# Patient Record
Sex: Female | Born: 1945 | Race: White | Hispanic: No | Marital: Married | Smoking: Never smoker
Health system: Southern US, Community
[De-identification: ages and names within clinical notes are randomized; demographics above are authoritative.]

## PROBLEM LIST (undated history)

## (undated) HISTORY — PX: CATARACT EXTRACTION: SUR2

## (undated) HISTORY — PX: CHOLECYSTECTOMY: SHX55

---

## 2011-08-18 ENCOUNTER — Ambulatory Visit: Payer: Self-pay | Admitting: Ophthalmology

## 2012-02-21 ENCOUNTER — Ambulatory Visit: Payer: Self-pay | Admitting: Family Medicine

## 2014-04-16 IMAGING — US US EXTREM LOW VENOUS*R*
1 series · 14 of 22 positions shown · non-contrast
Comparison: none

REASON FOR EXAM: CR [id number] Rt Leg Pain Swelling Eval DVT
COMMENTS:

[Series 1: us extrem low venous*right* · 0.09mm/px · 14 of 22 slices shown]
[im 1/22]
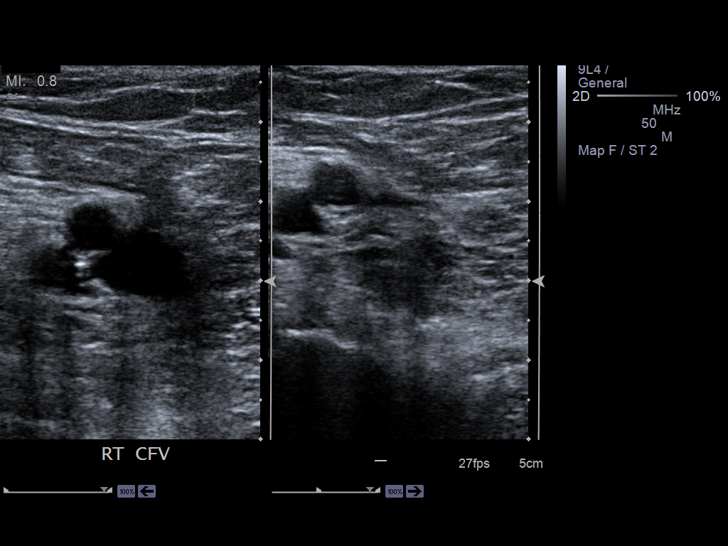
[im 3/22]
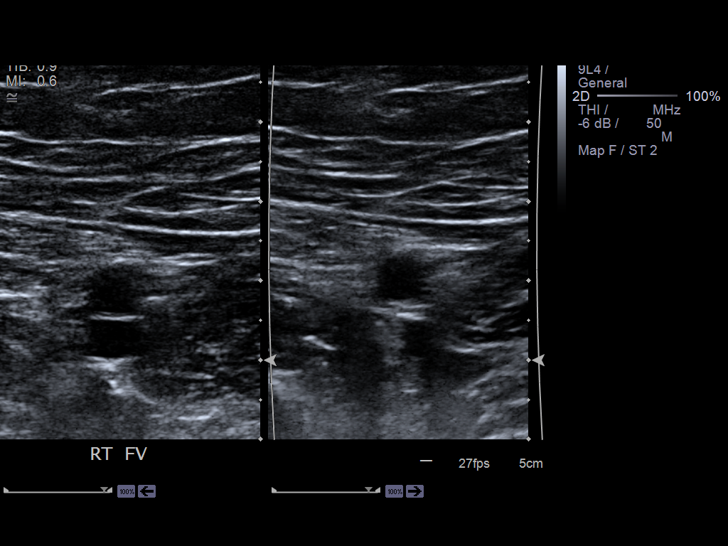
[im 4/22]
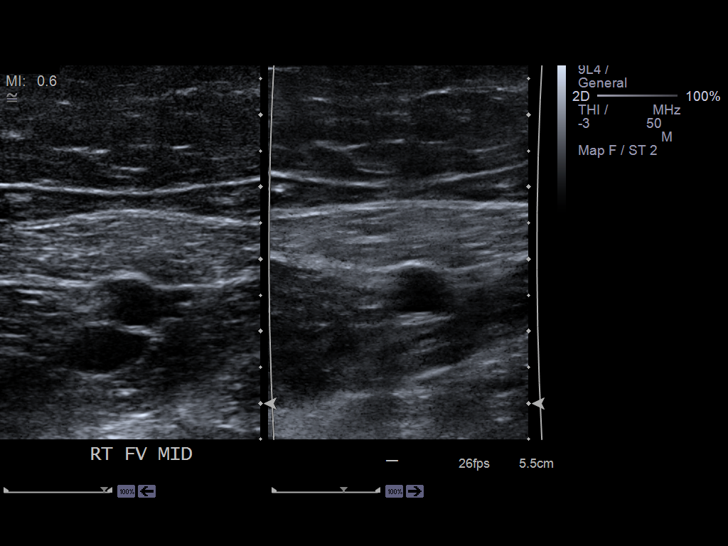
[im 6/22]
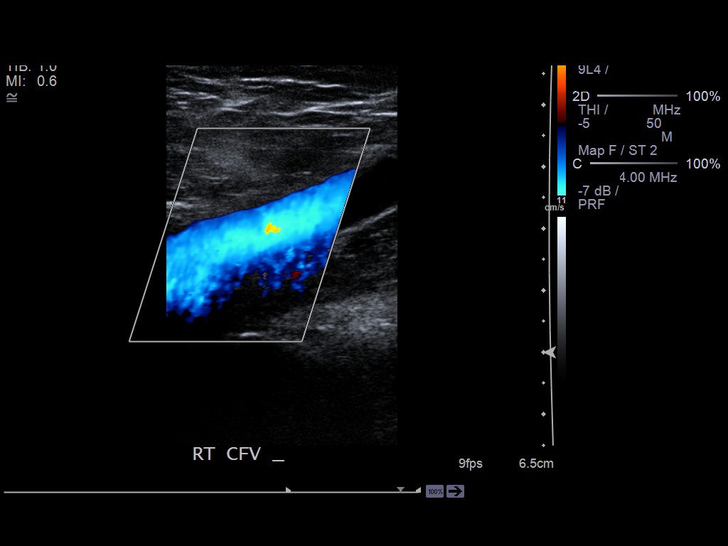
[im 8/22]
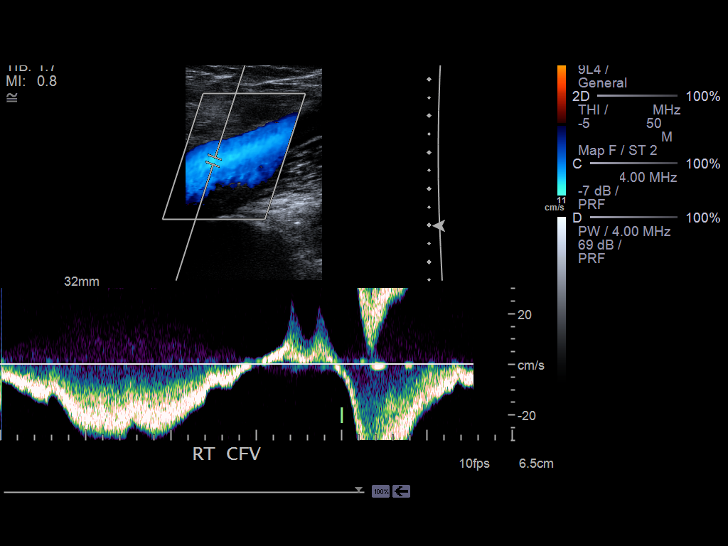
[im 9/22]
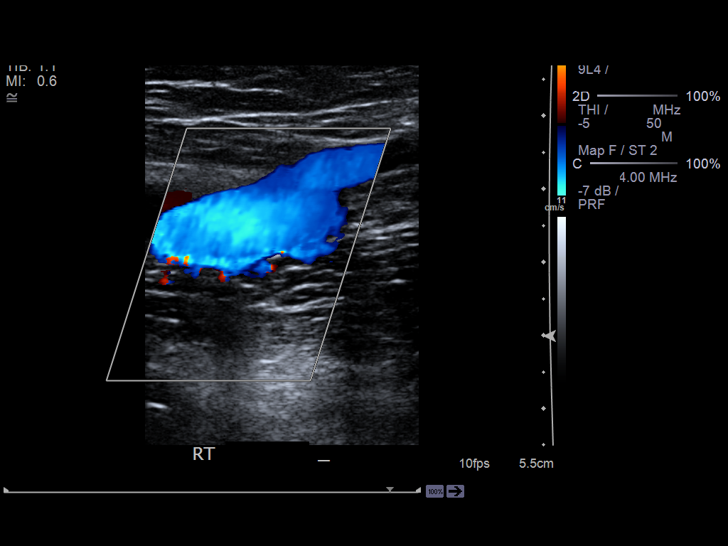
[im 11/22]
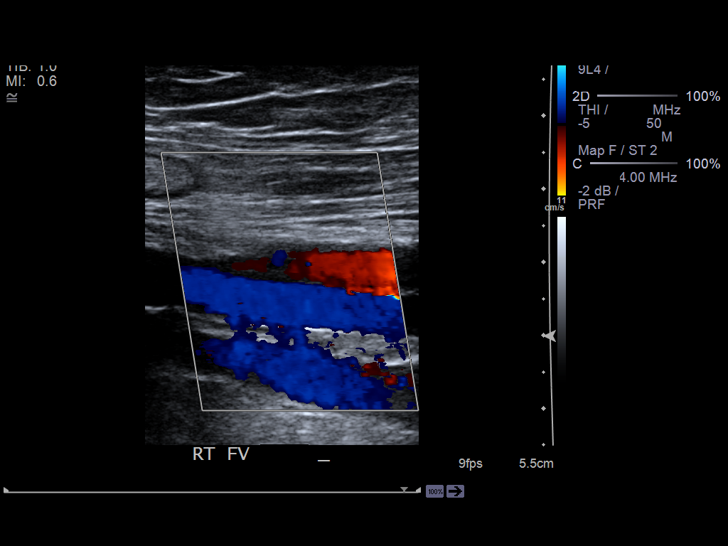
[im 12/22]
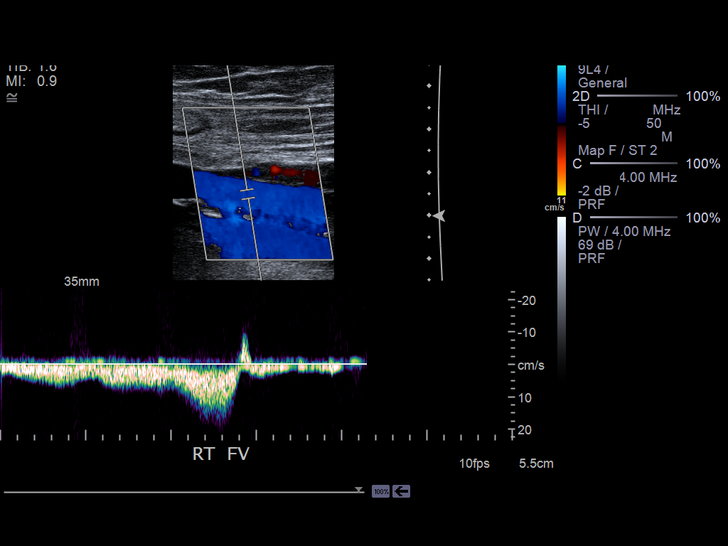
[im 14/22]
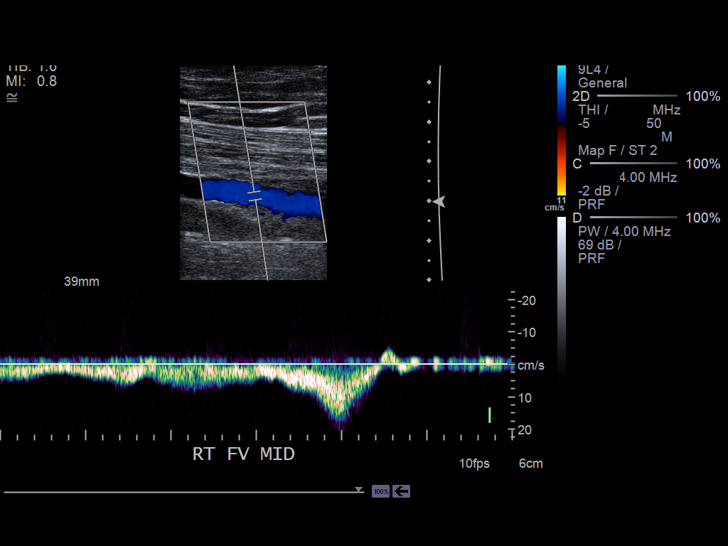
[im 15/22]
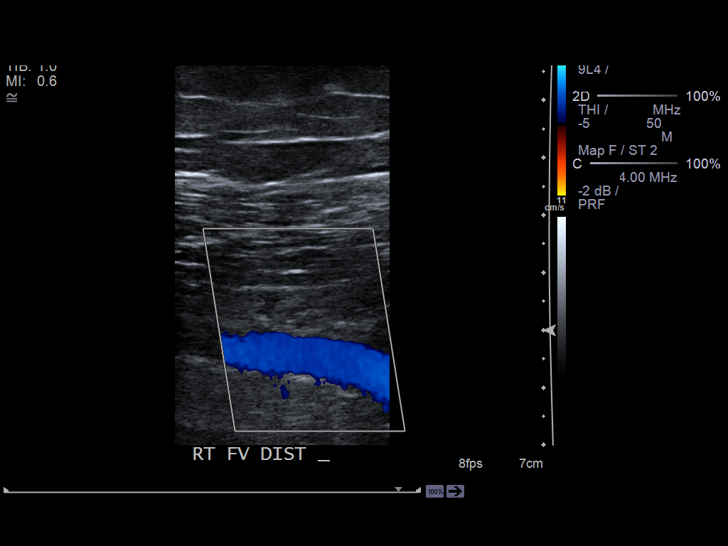
[im 17/22]
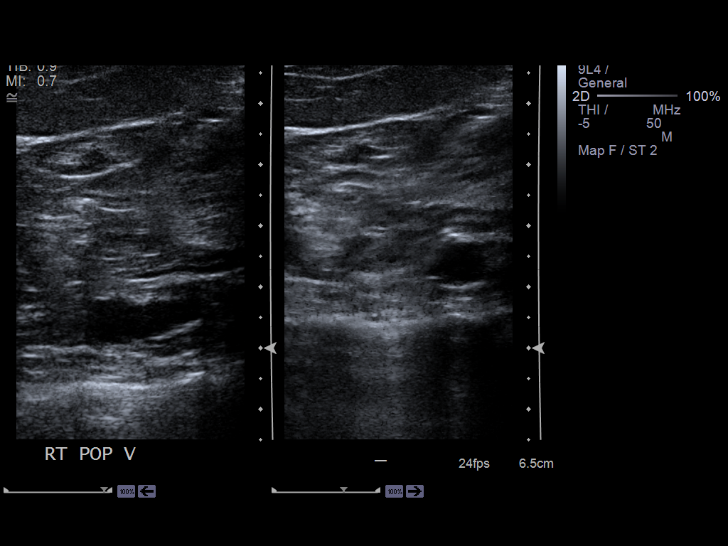
[im 19/22]
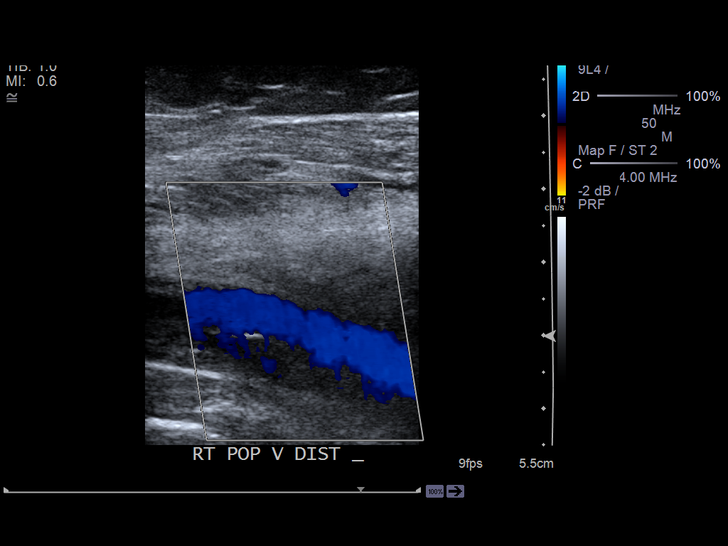
[im 20/22]
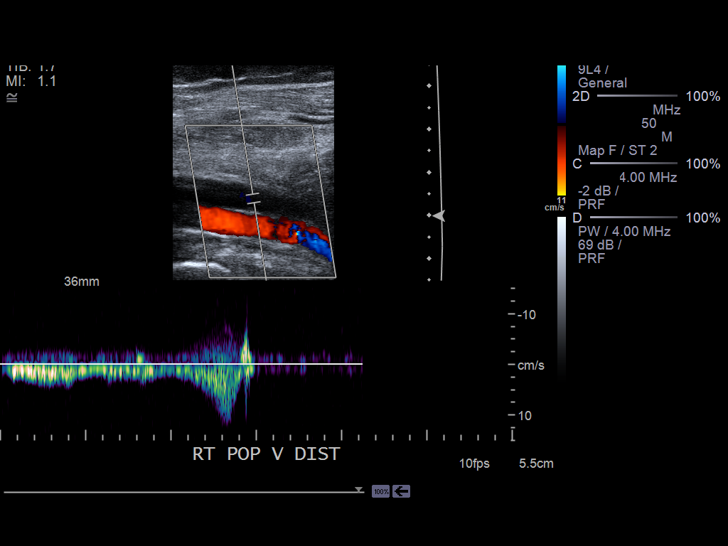
[im 22/22]
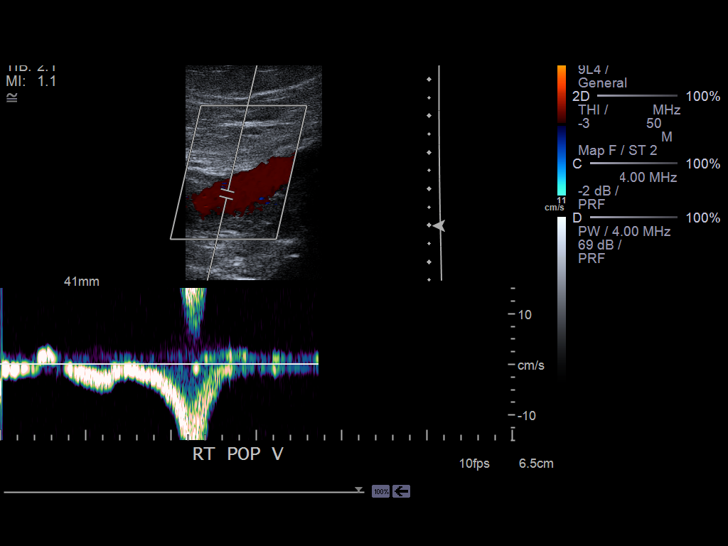

[14 of 22 positions shown; findings below may reference images not displayed]

PROCEDURE:     WM - WM DOPPLER VEINS RT LEG EXTR  - February 21, 2012  [DATE]

RESULT:     Comparison: None

Technique and findings: Multiple longitudinal and transverse grayscale as
well as color and spectral Doppler images of the right lower extremity veins
were obtained from the common femoral veins through the popliteal veins.

The right common femoral, femoral, and popliteal veins are patent,
demonstrating normal color-flow and compressibility. No intraluminal
thrombus is identified.  There is normal respiratory variation and
augmentation demonstrated at all vein levels.
IMPRESSION: No evidence of DVT in the right lower extremity.

## 2017-02-28 ENCOUNTER — Encounter: Payer: Self-pay | Admitting: Emergency Medicine

## 2017-02-28 ENCOUNTER — Ambulatory Visit
Admission: EM | Admit: 2017-02-28 | Discharge: 2017-02-28 | Disposition: A | Discharge status: Home / Self Care | Payer: Medicare Other | Attending: Family Medicine | Admitting: Family Medicine

## 2017-02-28 DIAGNOSIS — T63311A Toxic effect of venom of black widow spider, accidental (unintentional), initial encounter: Secondary | ICD-10-CM

## 2017-02-28 NOTE — ED Provider Notes (Signed)
[facility]    CSN: [id number] Arrival date & time: 02/28/17  1355     History   Chief Complaint Chief Complaint  Patient presents with  . Insect Bite    HPI MECHELLE VANHAREN is a 71 y.o. female.   HPI  This 71 year old female who presents complaining she was bitten by a black widow spider approximately 3 hours ago. 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 She states that she has a bite over the DIP joint of her right fifth finger. At first she had a stinging pain and then a pain that would extend up her arm and recently she started having cramping and body aches. Cramping is now in her chest and her back and she feels as though her chest is being compressed. Symptom has developed approximately 3-3.5 hours following the initial bite. She does not have any nausea or vomiting. He does have swelling of her right fifth finger and hand. Not having shortness of breath.         History reviewed. No pertinent past medical history.  There are no active problems to display for this patient.   Past Surgical History:  Procedure Laterality Date  . CESAREAN SECTION    . CHOLECYSTECTOMY      OB History    No data available       Home Medications    Prior to Admission medications   Not on File    Family History History reviewed. No pertinent family history.  Social History Social History  Substance Use Topics  . Smoking status: Never Smoker  . Smokeless tobacco: Never Used  . Alcohol use Yes     Allergies   Patient has no known allergies.   Review of Systems Review of Systems  Constitutional: Positive for activity change. Negative for chills, fatigue and fever.  Musculoskeletal: Positive for arthralgias and myalgias.  Skin: Positive for wound.  All other systems reviewed and are negative.    Physical Exam Triage Vital Signs ED Triage Vitals  Enc Vitals Group     BP  02/28/17 1410 (!) 161/89     Pulse Rate 02/28/17 1410 64     Resp 02/28/17 1410 16     Temp 02/28/17 1410 98.1 F (36.7 C)     Temp Source 02/28/17 1410 Oral     SpO2 02/28/17 1410 100 %     Weight 02/28/17 1408 170 lb (77.1 kg)     Height 02/28/17 1408 5\' 1"  (1.549 m)     Head Circumference --      Peak Flow --      Pain Score 02/28/17 1409 10     Pain Loc --      Pain Edu? --      Excl. in GC? --    No data found.   Updated Vital Signs BP (!) 161/89 (BP Location: Left Arm)   Pulse 64   Temp 98.1 F (36.7 C) (Oral)   Resp 16   Ht 5\' 1"  (1.549 m)   Wt 170 lb (77.1 kg)   SpO2 100%   BMI 32.12 kg/m   Visual Acuity Right Eye Distance:   Left Eye Distance:   Bilateral Distance:    Right Eye Near:   Left Eye Near:    Bilateral Near:     Physical Exam  Constitutional: She is oriented to person, place, and time. She appears well-developed and well-nourished.  No distress.  HENT:  Head: Normocephalic.  Eyes: Pupils are equal, round, and reactive to light.  Neck: Normal range of motion.  Cardiovascular: Normal rate and regular rhythm.  Exam reveals no gallop and no friction rub.   No murmur heard. Pulmonary/Chest: Effort normal and breath sounds normal. No respiratory distress. She has no wheezes. She has no rales.  Musculoskeletal: Normal range of motion.  Neurological: She is alert and oriented to person, place, and time.  Skin: Skin is warm and dry. She is not diaphoretic.  Psychiatric: She has a normal mood and affect. Her behavior is normal. Judgment and thought content normal.  Nursing note and vitals reviewed.    UC Treatments / Results  Labs (all labs ordered are listed, but only abnormal results are displayed) Labs Reviewed - No data to display  EKG  EKG Interpretation None       Radiology No results found.  Procedures Procedures (including critical care time)  Medications Ordered in UC Medications - No data to display   Initial Impression  / Assessment and Plan / UC Course  I have reviewed the triage vital signs and the nursing notes.  Pertinent labs & imaging results that were available during my care of the patient were reviewed by me and considered in my medical decision making (see chart for details).     Discussion with the patient and her husband who accompanies her. Because of her spasm in her chest and back it is apparent that she's had either a moderate or severe envenomation. At any rate I don't have the  medications to treat appropriately. Therefore recommended that they go to the emergency department for further evaluation and care. Offered to transport by EMS however she refused and would rather have her husband drive her. They've decided to go to [facility]. He left our facility in stable condition. She will be by privately vehicle driven by her husband.  Final Clinical Impressions(s) / UC Diagnoses   Final diagnoses:  Black widow spider bite, accidental or unintentional, initial encounter    New Prescriptions There are no discharge medications for this patient.    Controlled Substance Prescriptions [place] Controlled Substance Registry consulted? Not Applicable   Lutricia Feil, PA-C 02/28/17 1451

## 2017-02-28 NOTE — ED Triage Notes (Signed)
Patient states that she thinks that she was bit by a black widow spider about 3 hours ago on her right 5th finger.

## 2020-09-23 ENCOUNTER — Encounter: Payer: Self-pay | Admitting: Ophthalmology

## 2020-09-23 ENCOUNTER — Other Ambulatory Visit: Payer: Self-pay

## 2020-09-26 ENCOUNTER — Other Ambulatory Visit
Admission: RE | Admit: 2020-09-26 | Discharge: 2020-09-26 | Disposition: A | Discharge status: Home / Self Care | Payer: Medicare PPO | Source: Ambulatory Visit | Attending: Ophthalmology | Admitting: Ophthalmology

## 2020-09-26 ENCOUNTER — Other Ambulatory Visit: Payer: Self-pay

## 2020-09-26 DIAGNOSIS — Z20822 Contact with and (suspected) exposure to covid-19: Secondary | ICD-10-CM | POA: Insufficient documentation

## 2020-09-26 DIAGNOSIS — Z01812 Encounter for preprocedural laboratory examination: Secondary | ICD-10-CM | POA: Insufficient documentation

## 2020-09-26 NOTE — Discharge Instructions (Signed)

## 2020-09-27 LAB — SARS CORONAVIRUS 2 (TAT 6-24 HRS): SARS Coronavirus 2: NEGATIVE

## 2020-09-30 ENCOUNTER — Encounter: Payer: Self-pay | Admitting: Ophthalmology

## 2020-09-30 ENCOUNTER — Other Ambulatory Visit: Payer: Self-pay

## 2020-09-30 ENCOUNTER — Ambulatory Visit
Admission: RE | Admit: 2020-09-30 | Discharge: 2020-09-30 | Disposition: A | Discharge status: Home / Self Care | Payer: Medicare PPO | Attending: Ophthalmology | Admitting: Ophthalmology

## 2020-09-30 ENCOUNTER — Ambulatory Visit: Payer: Medicare PPO | Admitting: Anesthesiology

## 2020-09-30 ENCOUNTER — Encounter
Admission: RE | Disposition: A | Discharge status: Home / Self Care | Payer: Self-pay | Source: Home / Self Care | Attending: Ophthalmology

## 2020-09-30 DIAGNOSIS — H2511 Age-related nuclear cataract, right eye: Secondary | ICD-10-CM | POA: Insufficient documentation

## 2020-09-30 HISTORY — PX: CATARACT EXTRACTION W/PHACO: SHX586

## 2020-09-30 SURGERY — PHACOEMULSIFICATION, CATARACT, WITH IOL INSERTION
Anesthesia: Monitor Anesthesia Care | Site: Eye | Laterality: Right

## 2020-09-30 MED ORDER — TETRACAINE HCL 0.5 % OP SOLN
1.0000 [drp] | OPHTHALMIC | Status: DC | PRN
  Administered 2020-09-30 (×3): 1 [drp] via OPHTHALMIC

## 2020-09-30 MED ORDER — MOXIFLOXACIN HCL 0.5 % OP SOLN
OPHTHALMIC | Status: DC | PRN
  Administered 2020-09-30: 0.2 mL via OPHTHALMIC

## 2020-09-30 MED ORDER — ARMC OPHTHALMIC DILATING DROPS
1.0000 "application " | OPHTHALMIC | Status: DC | PRN
  Administered 2020-09-30 (×3): 1 via OPHTHALMIC

## 2020-09-30 MED ORDER — BRIMONIDINE TARTRATE-TIMOLOL 0.2-0.5 % OP SOLN
OPHTHALMIC | Status: DC | PRN
  Administered 2020-09-30: 1 [drp] via OPHTHALMIC

## 2020-09-30 MED ORDER — ACETAMINOPHEN 160 MG/5ML PO SOLN: 325.0000 mg | ORAL | Status: DC | PRN

## 2020-09-30 MED ORDER — FENTANYL CITRATE (PF) 100 MCG/2ML IJ SOLN
INTRAMUSCULAR | Status: DC | PRN
  Administered 2020-09-30: 50 ug via INTRAVENOUS

## 2020-09-30 MED ORDER — LIDOCAINE HCL (PF) 2 % IJ SOLN
INTRAOCULAR | Status: DC | PRN
  Administered 2020-09-30: 2 mL

## 2020-09-30 MED ORDER — ONDANSETRON HCL 4 MG/2ML IJ SOLN: 4.0000 mg | Freq: Once | INTRAMUSCULAR | Status: DC | PRN

## 2020-09-30 MED ORDER — MIDAZOLAM HCL 2 MG/2ML IJ SOLN
INTRAMUSCULAR | Status: DC | PRN
  Administered 2020-09-30: 2 mg via INTRAVENOUS

## 2020-09-30 MED ORDER — ACETAMINOPHEN 325 MG PO TABS: 325.0000 mg | ORAL_TABLET | ORAL | Status: DC | PRN

## 2020-09-30 MED ORDER — NA CHONDROIT SULF-NA HYALURON 40-17 MG/ML IO SOLN
INTRAOCULAR | Status: DC | PRN
  Administered 2020-09-30: 1 mL via INTRAOCULAR

## 2020-09-30 MED ORDER — EPINEPHRINE PF 1 MG/ML IJ SOLN
INTRAOCULAR | Status: DC | PRN
  Administered 2020-09-30: 42 mL via OPHTHALMIC

## 2020-09-30 SURGICAL SUPPLY — 17 items
CANNULA ANT/CHMB 27G (MISCELLANEOUS) ×2 IMPLANT
CANNULA ANT/CHMB 27GA (MISCELLANEOUS) ×4 IMPLANT
GLOVE SURG TRIUMPH 8.0 PF LTX (GLOVE) ×3 IMPLANT
GOWN STRL REUS W/ TWL LRG LVL3 (GOWN DISPOSABLE) ×2 IMPLANT
GOWN STRL REUS W/TWL LRG LVL3 (GOWN DISPOSABLE) ×4
LENS IOL TECNIS EYHANCE 21.5 (Intraocular Lens) ×1 IMPLANT
MARKER SKIN DUAL TIP RULER LAB (MISCELLANEOUS) ×2 IMPLANT
NDL FILTER BLUNT 18X1 1/2 (NEEDLE) ×1 IMPLANT
NEEDLE FILTER BLUNT 18X 1/2SAF (NEEDLE) ×1
NEEDLE FILTER BLUNT 18X1 1/2 (NEEDLE) ×1 IMPLANT
PACK EYE AFTER SURG (MISCELLANEOUS) ×2 IMPLANT
PACK OPTHALMIC (MISCELLANEOUS) ×2 IMPLANT
PACK PORFILIO (MISCELLANEOUS) ×2 IMPLANT
SYR 3ML LL SCALE MARK (SYRINGE) ×2 IMPLANT
SYR TB 1ML LUER SLIP (SYRINGE) ×2 IMPLANT
WATER STERILE IRR 250ML POUR (IV SOLUTION) ×2 IMPLANT
WIPE NON LINTING 3.25X3.25 (MISCELLANEOUS) ×2 IMPLANT

## 2020-09-30 NOTE — Anesthesia Preprocedure Evaluation (Signed)
Anesthesia Evaluation  Patient identified by MRN, date of birth, ID band Patient awake    Reviewed: Allergy & Precautions, NPO status   Airway Mallampati: II  TM Distance: >3 FB     Dental   Pulmonary neg pulmonary ROS,    Pulmonary exam normal        Cardiovascular negative cardio ROS   Rhythm:Regular Rate:Normal     Neuro/Psych    GI/Hepatic negative GI ROS,   Endo/Other  Obesity - BMI 30  Renal/GU      Musculoskeletal   Abdominal   Peds  Hematology   Anesthesia Other Findings   Reproductive/Obstetrics                             Anesthesia Physical Anesthesia Plan  ASA: II  Anesthesia Plan: MAC   Post-op Pain Management:    Induction: Intravenous  PONV Risk Score and Plan: TIVA, Midazolam and Treatment may vary due to age or medical condition  Airway Management Planned: Natural Airway and Nasal Cannula  Additional Equipment:   Intra-op Plan:   Post-operative Plan:   Informed Consent: I have reviewed the patients History and Physical, chart, labs and discussed the procedure including the risks, benefits and alternatives for the proposed anesthesia with the patient or authorized representative who has indicated his/her understanding and acceptance.       Plan Discussed with: CRNA  Anesthesia Plan Comments:         Anesthesia Quick Evaluation

## 2020-09-30 NOTE — H&P (Signed)
[facility]   Primary Care Physician:  [facility] [place] Primary Ophthalmologist: Dr. Druscilla Brownie  Pre-Procedure History & Physical: HPI:  Sheila Elliott is a 75 y.o. female here for cataract surgery.   History reviewed. No pertinent past medical history.  Past Surgical History:  Procedure Laterality Date  . CATARACT EXTRACTION    . CESAREAN SECTION    . CHOLECYSTECTOMY      Prior to Admission medications   Medication Sig Start Date End Date Taking? Authorizing Provider  Collagen-Vitamin C-Biotin (COLLAGEN 1500/C) 500-50-0.8 MG CAPS Take by mouth.   Yes [provider]  Multiple Vitamin (MULTIVITAMIN) capsule Take 1 capsule by mouth daily.   Yes [provider]    Allergies as of 08/05/2020  . (No Known Allergies)    History reviewed. No pertinent family history.  Social History   Socioeconomic History  . Marital status: Married    Spouse name: Not on file  . Number of children: Not on file  . Years of education: Not on file  . Highest education level: Not on file  Occupational History  . Not on file  Tobacco Use  . Smoking status: Never Smoker  . Smokeless tobacco: Never Used  Vaping Use  . Vaping Use: Never used  Substance and Sexual Activity  . Alcohol use: Yes    Alcohol/week: 2.0 standard drinks    Types: 1 Cans of beer, 1 Shots of liquor per week  . Drug use: Not on file  . Sexual activity: Not on file  Other Topics Concern  . Not on file  Social History Narrative  . Not on file   Social Determinants of Health   Financial Resource Strain: Not on file  Food Insecurity: Not on file  Transportation Needs: Not on file  Physical Activity: Not on file  Stress: Not on file  Social Connections: Not on file  Intimate Partner Violence: Not on file    Review of Systems: See HPI, otherwise negative ROS  Physical Exam: BP (!) 156/75   Pulse (!) 59   Temp (!) 96.8 F (36 C) (Temporal)   Resp 16   Ht 5\' 1"  (1.549 m)   Wt 76.2 kg    SpO2 100%   BMI 31.72 kg/m  General:   Alert,  pleasant and cooperative in NAD Head:  Normocephalic and atraumatic. Respiratory:  Normal work of breathing. Cardiovascular:  RRR  Impression/Plan: Sheila Elliott is here for cataract surgery.  Risks, benefits, limitations, and alternatives regarding cataract surgery have been reviewed with the patient.  Questions have been answered.  All parties agreeable.   Nancie Neas, MD  09/30/2020, 11:53 AM

## 2020-09-30 NOTE — Op Note (Signed)
PREOPERATIVE DIAGNOSIS:  Nuclear sclerotic cataract of the right eye.   POSTOPERATIVE DIAGNOSIS:  Cataract   OPERATIVE PROCEDURE:@   SURGEON:  Galen Manila, MD.   ANESTHESIA:  Anesthesiologist: Jola Babinski, MD CRNA: Jimmy Picket, CRNA; Maree Krabbe, CRNA  1.      Managed anesthesia care. 2.      0.70ml of Shugarcaine was instilled in the eye following the paracentesis.   COMPLICATIONS:  None.   TECHNIQUE:   Stop and chop   DESCRIPTION OF PROCEDURE:  The patient was examined and consented in the preoperative holding area where the aforementioned topical anesthesia was applied to the right eye and then brought back to the Operating Room where the right eye was prepped and draped in the usual sterile ophthalmic fashion and a lid speculum was placed. A paracentesis was created with the side port blade and the anterior chamber was filled with viscoelastic. A near clear corneal incision was performed with the steel keratome. A continuous curvilinear capsulorrhexis was performed with a cystotome followed by the capsulorrhexis forceps. Hydrodissection and hydrodelineation were carried out with BSS on a blunt cannula. The lens was removed in a stop and chop  technique and the remaining cortical material was removed with the irrigation-aspiration handpiece. The capsular bag was inflated with viscoelastic and the Technis [id number]  lens was placed in the capsular bag without complication. The remaining viscoelastic was removed from the eye with the irrigation-aspiration handpiece. The wounds were hydrated. The anterior chamber was flushed with BSS and the eye was inflated to physiologic pressure. 0.63ml of Vigamox was placed in the anterior chamber. The wounds were found to be water tight. The eye was dressed with Combigan. The patient was given protective glasses to wear throughout the day and a shield with which to sleep tonight. The patient was also given drops with which to begin a drop regimen today  and will follow-up with me in one day. Implant Name Type Inv. Item Serial No. Manufacturer Lot No. LRB No. Used Action  LENS IOL TECNIS EYHANCE 21.5 - [id number] Intraocular Lens LENS IOL TECNIS EYHANCE 21.5 [id number] JOHNSON    1 Implanted   Procedure(s) with comments: CATARACT EXTRACTION PHACO AND INTRAOCULAR LENS PLACEMENT (IOC) RIGHT 6.41 00:35.6 (Right) - requests arrival after 0930  Electronically signed: Galen Manila 09/30/2020 12:17 PM

## 2020-09-30 NOTE — Anesthesia Procedure Notes (Signed)
Performed by: Vasilios Ottaway, CRNA Pre-anesthesia Checklist: Patient identified, Emergency Drugs available, Suction available, Timeout performed and Patient being monitored Patient Re-evaluated:Patient Re-evaluated prior to induction Oxygen Delivery Method: Nasal cannula Placement Confirmation: positive ETCO2       

## 2020-09-30 NOTE — Anesthesia Postprocedure Evaluation (Signed)
Anesthesia Post Note  Patient: Sheila Elliott  Procedure(s) Performed: CATARACT EXTRACTION PHACO AND INTRAOCULAR LENS PLACEMENT (IOC) RIGHT 6.41 00:35.6 (Right Eye)     Patient location during evaluation: PACU Anesthesia Type: MAC Level of consciousness: awake Pain management: pain level controlled Vital Signs Assessment: post-procedure vital signs reviewed and stable Respiratory status: respiratory function stable Cardiovascular status: stable Postop Assessment: no apparent nausea or vomiting Anesthetic complications: no   No complications documented.  Veda Canning

## 2020-09-30 NOTE — Transfer of Care (Signed)
Immediate Anesthesia Transfer of Care Note  Patient: Sheila Elliott  Procedure(s) Performed: CATARACT EXTRACTION PHACO AND INTRAOCULAR LENS PLACEMENT (IOC) RIGHT 6.41 00:35.6 (Right Eye)  Patient Location: PACU  Anesthesia Type: MAC  Level of Consciousness: awake, alert  and patient cooperative  Airway and Oxygen Therapy: Patient Spontanous Breathing and Patient connected to supplemental oxygen  Post-op Assessment: Post-op Vital signs reviewed, Patient's Cardiovascular Status Stable, Respiratory Function Stable, Patent Airway and No signs of Nausea or vomiting  Post-op Vital Signs: Reviewed and stable  Complications: No complications documented.

## 2020-10-01 ENCOUNTER — Encounter: Payer: Self-pay | Admitting: Ophthalmology
